# Patient Record
Sex: Male | Born: 1935 | Race: White | Hispanic: No | Marital: Single | Smoking: Former smoker
Health system: Southern US, Community
[De-identification: ages and names within clinical notes are randomized; demographics above are authoritative.]

## PROBLEM LIST (undated history)

## (undated) DIAGNOSIS — I10 Essential (primary) hypertension: Secondary | ICD-10-CM

## (undated) HISTORY — PX: CARDIAC SURGERY: SHX584

---

## 2017-02-20 ENCOUNTER — Other Ambulatory Visit: Payer: Self-pay

## 2017-02-20 ENCOUNTER — Encounter (HOSPITAL_COMMUNITY): Payer: Self-pay | Admitting: Emergency Medicine

## 2017-02-20 ENCOUNTER — Emergency Department (HOSPITAL_COMMUNITY)
Admission: EM | Admit: 2017-02-20 | Discharge: 2017-02-21 | Disposition: A | Discharge status: Home / Self Care | Payer: Medicare Other | Attending: Emergency Medicine | Admitting: Emergency Medicine

## 2017-02-20 DIAGNOSIS — I2510 Atherosclerotic heart disease of native coronary artery without angina pectoris: Secondary | ICD-10-CM | POA: Diagnosis not present

## 2017-02-20 DIAGNOSIS — Z87891 Personal history of nicotine dependence: Secondary | ICD-10-CM | POA: Diagnosis not present

## 2017-02-20 DIAGNOSIS — J181 Lobar pneumonia, unspecified organism: Secondary | ICD-10-CM | POA: Diagnosis not present

## 2017-02-20 DIAGNOSIS — R110 Nausea: Secondary | ICD-10-CM | POA: Insufficient documentation

## 2017-02-20 DIAGNOSIS — I10 Essential (primary) hypertension: Secondary | ICD-10-CM | POA: Insufficient documentation

## 2017-02-20 DIAGNOSIS — R55 Syncope and collapse: Secondary | ICD-10-CM | POA: Diagnosis present

## 2017-02-20 DIAGNOSIS — Z7902 Long term (current) use of antithrombotics/antiplatelets: Secondary | ICD-10-CM | POA: Diagnosis not present

## 2017-02-20 DIAGNOSIS — R61 Generalized hyperhidrosis: Secondary | ICD-10-CM | POA: Diagnosis not present

## 2017-02-20 DIAGNOSIS — Z955 Presence of coronary angioplasty implant and graft: Secondary | ICD-10-CM | POA: Insufficient documentation

## 2017-02-20 DIAGNOSIS — Z79899 Other long term (current) drug therapy: Secondary | ICD-10-CM | POA: Insufficient documentation

## 2017-02-20 DIAGNOSIS — J189 Pneumonia, unspecified organism: Secondary | ICD-10-CM

## 2017-02-20 DIAGNOSIS — R42 Dizziness and giddiness: Secondary | ICD-10-CM | POA: Insufficient documentation

## 2017-02-20 HISTORY — DX: Essential (primary) hypertension: I10

## 2017-02-20 LAB — URINALYSIS, ROUTINE W REFLEX MICROSCOPIC
Bacteria, UA: NONE SEEN
Bilirubin Urine: NEGATIVE
GLUCOSE, UA: NEGATIVE mg/dL
KETONES UR: NEGATIVE mg/dL
Leukocytes, UA: NEGATIVE
NITRITE: NEGATIVE
PH: 5 (ref 5.0–8.0)
PROTEIN: NEGATIVE mg/dL
Specific Gravity, Urine: 1.012 (ref 1.005–1.030)
Squamous Epithelial / LPF: NONE SEEN

## 2017-02-20 LAB — CBG MONITORING, ED: GLUCOSE-CAPILLARY: 133 mg/dL — AB (ref 65–99)

## 2017-02-20 LAB — CBC WITH DIFFERENTIAL/PLATELET
BASOS ABS: 0 10*3/uL (ref 0.0–0.1)
BASOS PCT: 0 %
EOS ABS: 0.2 10*3/uL (ref 0.0–0.7)
Eosinophils Relative: 2 %
HCT: 36.7 % — ABNORMAL LOW (ref 39.0–52.0)
HEMOGLOBIN: 12.2 g/dL — AB (ref 13.0–17.0)
Lymphocytes Relative: 13 %
Lymphs Abs: 1.5 10*3/uL (ref 0.7–4.0)
MCH: 29.8 pg (ref 26.0–34.0)
MCHC: 33.2 g/dL (ref 30.0–36.0)
MCV: 89.5 fL (ref 78.0–100.0)
Monocytes Absolute: 0.6 10*3/uL (ref 0.1–1.0)
Monocytes Relative: 5 %
NEUTROS PCT: 80 %
Neutro Abs: 9.7 10*3/uL — ABNORMAL HIGH (ref 1.7–7.7)
PLATELETS: 207 10*3/uL (ref 150–400)
RBC: 4.1 MIL/uL — AB (ref 4.22–5.81)
RDW: 15.5 % (ref 11.5–15.5)
WBC: 12.1 10*3/uL — AB (ref 4.0–10.5)

## 2017-02-20 LAB — BASIC METABOLIC PANEL
Anion gap: 9 (ref 5–15)
BUN: 28 mg/dL — ABNORMAL HIGH (ref 6–20)
CALCIUM: 8.5 mg/dL — AB (ref 8.9–10.3)
CHLORIDE: 103 mmol/L (ref 101–111)
CO2: 23 mmol/L (ref 22–32)
CREATININE: 1.38 mg/dL — AB (ref 0.61–1.24)
GFR, EST AFRICAN AMERICAN: 50 mL/min — AB (ref >60)
GFR, EST NON AFRICAN AMERICAN: 43 mL/min — AB (ref >60)
Glucose, Bld: 187 mg/dL — ABNORMAL HIGH (ref 65–99)
Potassium: 4 mmol/L (ref 3.5–5.1)
SODIUM: 135 mmol/L (ref 135–145)

## 2017-02-20 MED ORDER — SODIUM CHLORIDE 0.9 % IV BOLUS (SEPSIS)
500.0000 mL | Freq: Once | INTRAVENOUS | Status: AC
  Administered 2017-02-20: 500 mL via INTRAVENOUS

## 2017-02-20 NOTE — ED Notes (Signed)
ED Provider at bedside. 

## 2017-02-20 NOTE — ED Provider Notes (Signed)
[facility] Provider Note   CSN: [id number] Arrival date & time: 02/20/17  2123     History   Chief Complaint Chief Complaint  Patient presents with  . Near Syncope    HPI Gary Best is a 83 y.o. male.  Patient with a history of CAD (stents 3 months ago), HTN, HLD presents for evaluation of 15-minute episode of dizziness and diaphoresis that occurred just prior to coming to ED. No chest pain or SOB. He experienced nausea without vomiting. He tried to stand during the episode and felt like he was off balance and sat back down immediately. He denies similar symptoms previously. He is currently back to his normal baseline and asymptomatic. No recent changes to his medications.    The history is provided by the patient and a relative. No language interpreter was used.  Near Syncope  Pertinent negatives include no chest pain and no shortness of breath.    Past Medical History:  Diagnosis Date  . Hypertension     There are no active problems to display for this patient.   The histories are not reviewed yet. Please review them in the "History" navigator section and refresh this SmartLink.     Home Medications    Prior to Admission medications   Medication Sig Start Date End Date Taking? Authorizing Provider  amiodarone (PACERONE) 100 MG tablet Take 100 mg by mouth daily.   Yes [provider]  clopidogrel (PLAVIX) 75 MG tablet Take 75 mg by mouth daily.   Yes [provider]  Coenzyme Q10 (COQ-10) 100 MG CAPS Take 100 mg by mouth daily.   Yes [provider]  losartan-hydrochlorothiazide (HYZAAR) 100-25 MG tablet Take 1 tablet by mouth daily.   Yes [provider]  lovastatin (MEVACOR) 20 MG tablet Take 20 mg by mouth at bedtime.   Yes [provider]  methylcellulose oral powder Take 2 packets by mouth daily.   Yes [provider]  Multiple Vitamin (MULTIVITAMIN WITH  MINERALS) TABS tablet Take 1 tablet by mouth daily.   Yes [provider]  ranitidine (ZANTAC) 150 MG tablet Take 150 mg by mouth daily as needed for heartburn.   Yes [provider]    Family History No family history on file.  Social History Social History   Tobacco Use  . Smoking status: Former Games developermoker  . Smokeless tobacco: Never Used  Substance Use Topics  . Alcohol use: Not on file  . Drug use: Not on file     Allergies   Patient has no known allergies.   Review of Systems Review of Systems  Constitutional: Positive for diaphoresis. Negative for chills and fever.  HENT: Negative.   Respiratory: Negative.  Negative for shortness of breath.   Cardiovascular: Positive for near-syncope. Negative for chest pain.  Gastrointestinal: Positive for nausea. Negative for vomiting.  Genitourinary: Negative.   Musculoskeletal: Negative.   Skin: Negative.   Neurological: Positive for dizziness.     Physical Exam Updated Vital Signs BP 120/60   Pulse 67   Temp 97.8 F (36.6 C) (Oral)   Resp 17   Ht 6' (1.829 m)   Wt 104.3 kg (230 lb)   SpO2 97%   BMI 31.19 kg/m   Physical Exam  Constitutional: He is oriented to person, place, and time. He appears well-developed and well-nourished. No distress.  HENT:  Head: Normocephalic and atraumatic.  Neck: Normal range of motion. Neck supple.  Cardiovascular: Normal  rate and regular rhythm.  No murmur heard. No carotid bruit.  Pulmonary/Chest: Effort normal and breath sounds normal.  Abdominal: Soft. Bowel sounds are normal. There is no tenderness. There is no rebound and no guarding.  Musculoskeletal: Normal range of motion.  Neurological: He is alert and oriented to person, place, and time. No sensory deficit. He exhibits normal muscle tone. Coordination normal.  CN's 3-12 grossly intact. Speech is clear and focused. No facial asymmetry. No lateralizing weakness. Reflexes are hyporeflexic but equal. No  deficits of coordination.     Skin: Skin is warm and dry. No rash noted. He is not diaphoretic.  Psychiatric: He has a normal mood and affect.     ED Treatments / Results  Labs (all labs ordered are listed, but only abnormal results are displayed) Labs Reviewed  BASIC METABOLIC PANEL - Abnormal; Notable for the following components:      Result Value   Glucose, Bld 187 (*)    BUN 28 (*)    Creatinine, Ser 1.38 (*)    Calcium 8.5 (*)    GFR calc non Af Amer 43 (*)    GFR calc Af Amer 50 (*)    All other components within normal limits  CBC WITH DIFFERENTIAL/PLATELET - Abnormal; Notable for the following components:   WBC 12.1 (*)    RBC 4.10 (*)    Hemoglobin 12.2 (*)    HCT 36.7 (*)    Neutro Abs 9.7 (*)    All other components within normal limits  CBG MONITORING, ED - Abnormal; Notable for the following components:   Glucose-Capillary 133 (*)    All other components within normal limits  URINALYSIS, ROUTINE W REFLEX MICROSCOPIC    EKG  EKG Interpretation  Date/Time:  Thursday February 20 2017 21:31:02 EST Ventricular Rate:  60 PR Interval:    QRS Duration: 140 QT Interval:  457 QTC Calculation: 457 R Axis:   77 Text Interpretation:  Sinus rhythm Prolonged PR interval IVCD, consider atypical RBBB No STEMI.  Confirmed by Alona BeneLong, Joshua [phone]([id number]) on 02/20/2017 10:33:11 PM       Radiology No results found.  Procedures Procedures (including critical care time)  Medications Ordered in ED Medications  sodium chloride 0.9 % bolus 500 mL (500 mLs Intravenous New Bag/Given 02/20/17 2309)     Initial Impression / Assessment and Plan / ED Course  I have reviewed the triage vital signs and the nursing notes.  Pertinent labs & imaging results that were available during my care of the patient were reviewed by me and considered in my medical decision making (see chart for details).     Patient presents for evaluation of 15-minute episode of dizziness. He is  asymptomatic now.   EKG is unremarkable and shows a regular rhythm. Labs are not significantly abnormal. VSS. No orthostatic hypotension.   Blood pressure continues to improve in ED. He is examined by Dr. Jacqulyn BathLong. UA and CXR added secondary to family concerns. UA is negative and CXR shows ?opacity/infiltrate. He remains afebrile without tachycardia or tachypnea. No hypoxia. Will provide abx for coverage of abnormal CXR but feel he is stable for discharge home. Return precautions discussed.   Final Clinical Impressions(s) / ED Diagnoses   Final diagnoses:  None   1. LLL CAP  ED Discharge Orders    None       Elpidio AnisUpstill, Ellarose Brandi, PA-C 02/21/17 [id number]    Maia PlanLong, Joshua G, MD 02/21/17 [phone]

## 2017-02-20 NOTE — ED Triage Notes (Signed)
[facility] to  [facility]. Pt suddenly became pale, diaphoretic, dizzy, and per pt felt like he was going to pass out but family confirms no LOC. Initial vitals were BP 80 palpated, 74 HR, 93% room air. Pt given 500 ml bolus and bp to 98/56. Pt denies cp, n/v. Pt taking amniodorone, aspirin, losartan, lovastatin, and plavix. Pt A/O x4 and in NAD at this time.

## 2017-02-21 ENCOUNTER — Emergency Department (HOSPITAL_COMMUNITY): Payer: Medicare Other

## 2017-02-21 MED ORDER — AMOXICILLIN 500 MG PO CAPS: 1000.0000 mg | ORAL_CAPSULE | Freq: Two times a day (BID) | ORAL | 0 refills | Status: AC

## 2017-02-21 MED ORDER — AZITHROMYCIN 250 MG PO TABS: 500.0000 mg | ORAL_TABLET | Freq: Once | ORAL | Status: DC

## 2017-02-21 MED ORDER — AMOXICILLIN 500 MG PO CAPS
1000.0000 mg | ORAL_CAPSULE | Freq: Once | ORAL | Status: AC
  Administered 2017-02-21: 1000 mg via ORAL
  Filled 2017-02-21: qty 2

## 2017-02-21 NOTE — Discharge Instructions (Addendum)
Return to the emergency department with any worsening symptoms or new concerns. Take Amoxil as directed for possible early pneumonia in the left lung. Follow up with your doctor when you return home.

## 2017-02-21 NOTE — ED Notes (Signed)
Patient transported to X-ray 

## 2017-02-21 NOTE — ED Notes (Signed)
ED Provider at bedside. 

## 2019-08-06 IMAGING — CR DG CHEST 2V
2 series · 2 of 2 positions shown · non-contrast
Comparison: None.

CLINICAL DATA: Acute onset of cough and congestion.

EXAM:
CHEST  2 VIEW

[chest lat]
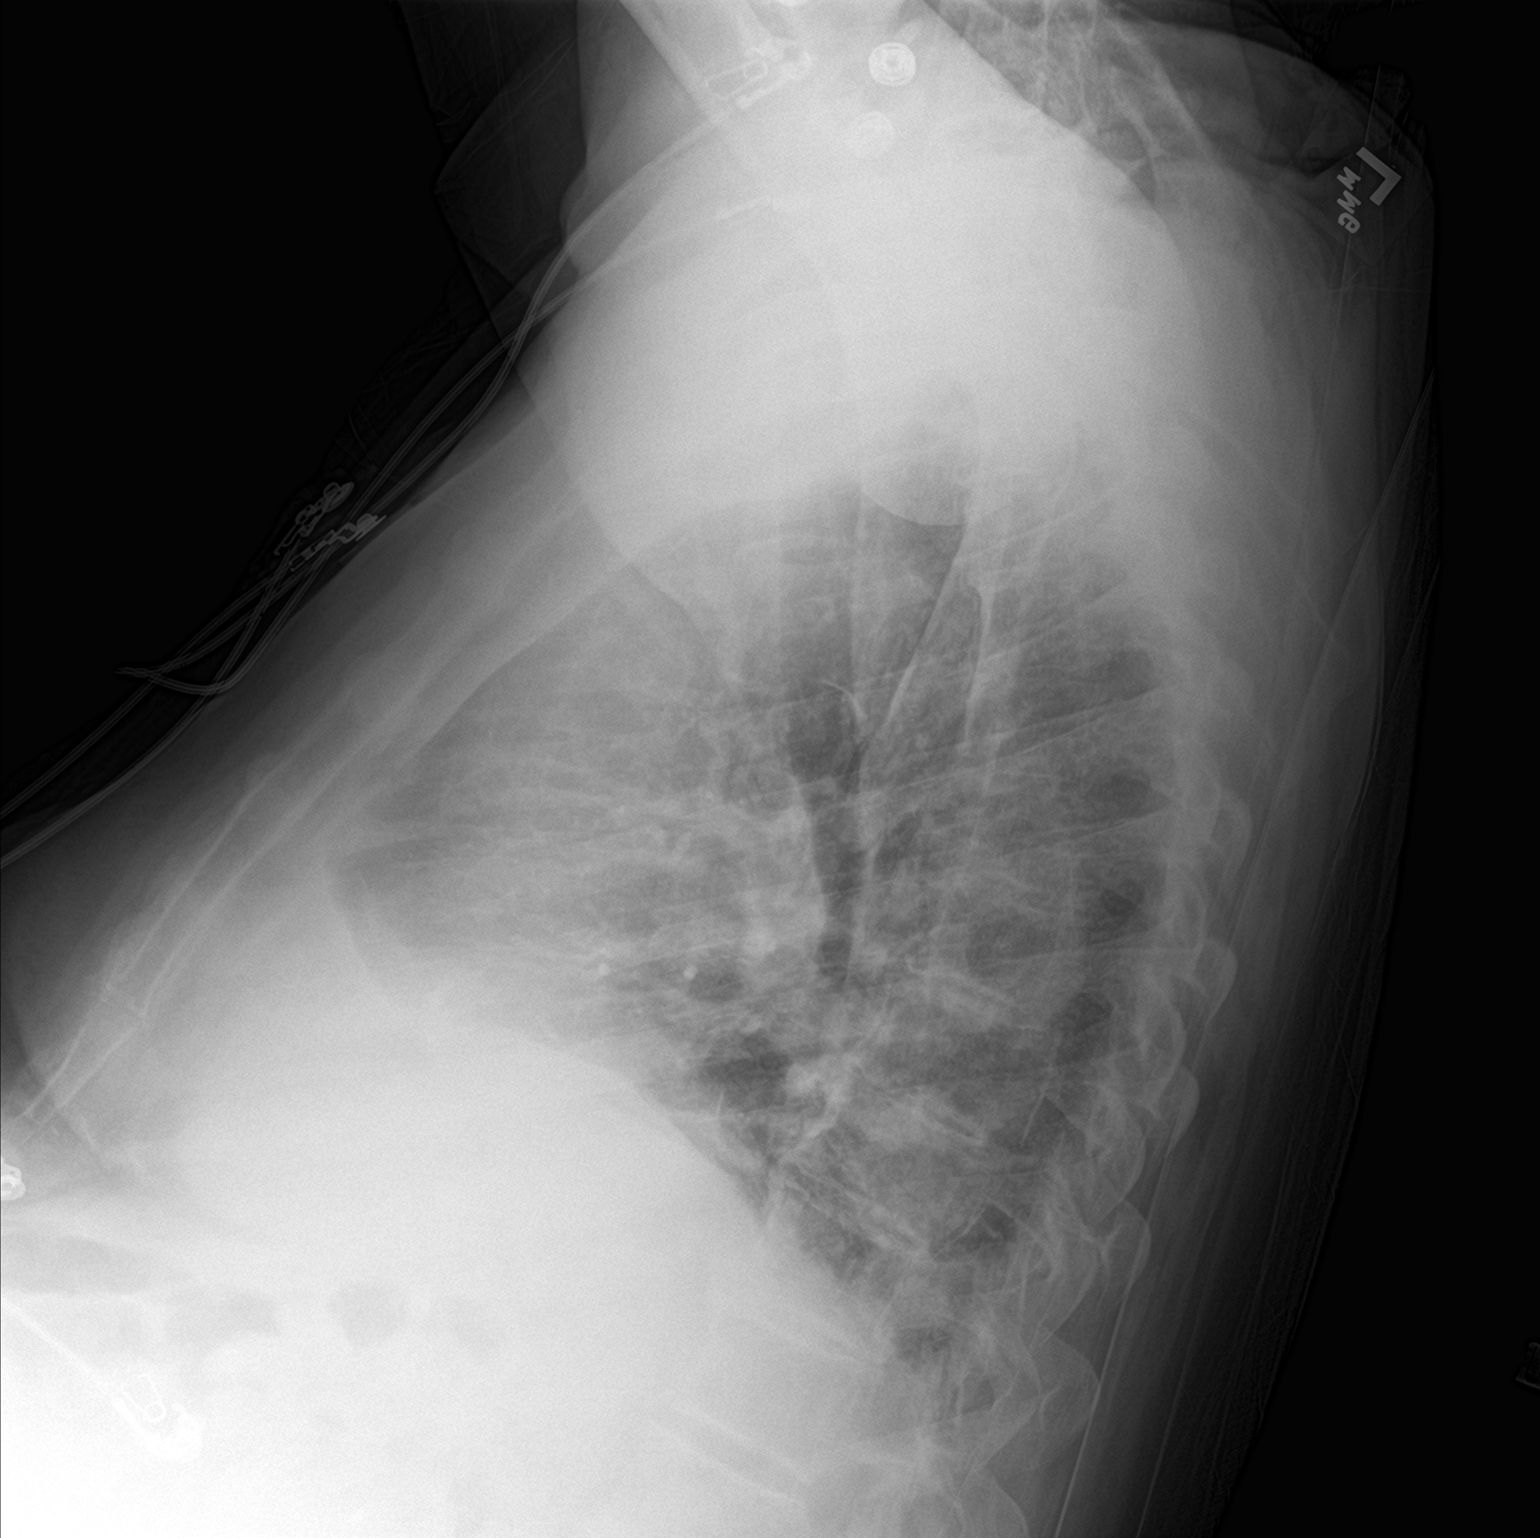

[chest ap]
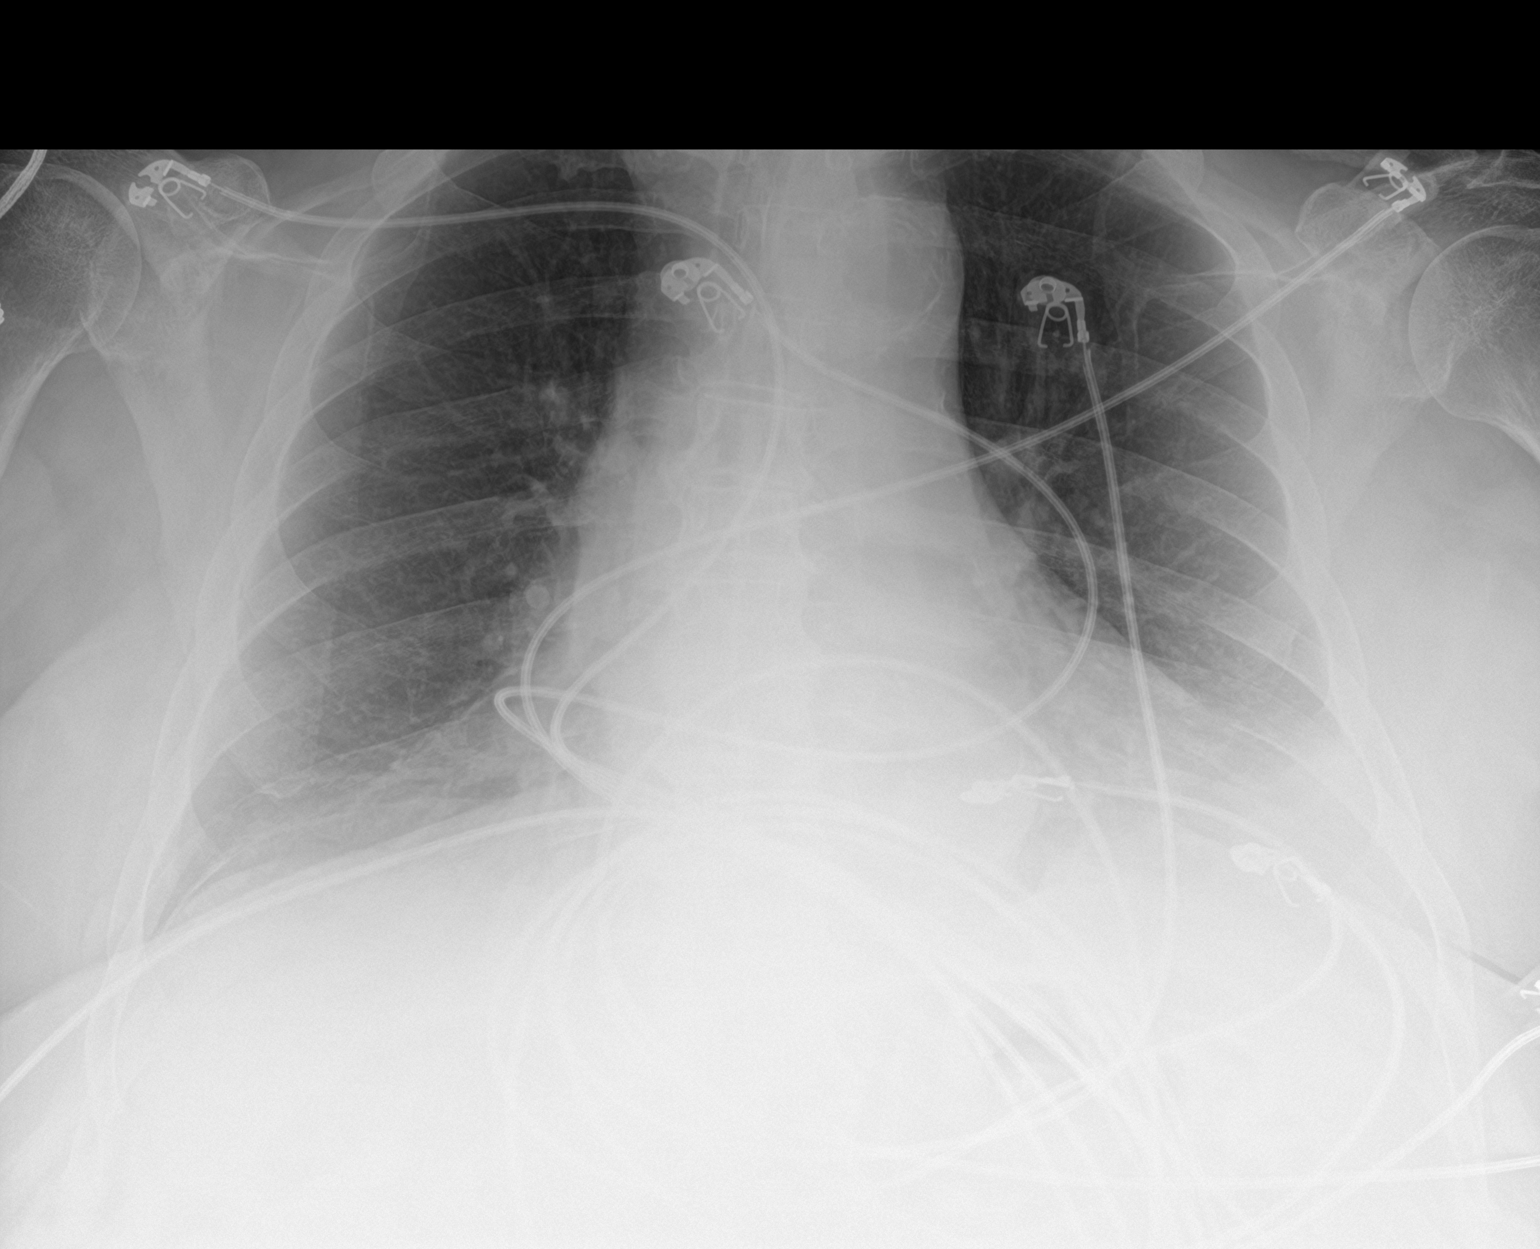

[2 of 2 positions shown; findings below may reference images not displayed]

FINDINGS: The lungs are well-aerated. Left basilar airspace opacity could
reflect pneumonia. There is no evidence of pleural effusion or
pneumothorax.

The heart is normal in size; the mediastinal contour is within
normal limits. No acute osseous abnormalities are seen.
IMPRESSION: Left basilar airspace opacity could reflect pneumonia.
